# Patient Record
Sex: Male | Born: 1960 | Race: White | Hispanic: No | Marital: Single | Smoking: Never smoker
Health system: Southern US, Community
[De-identification: ages and names within clinical notes are randomized; demographics above are authoritative.]

## PROBLEM LIST (undated history)

## (undated) HISTORY — PX: OTHER SURGICAL HISTORY: SHX169

## (undated) HISTORY — PX: SINUS SURGERY WITH INSTATRAK: SHX5215

## (undated) HISTORY — PX: NASAL SINUS SURGERY: SHX719

---

## 1998-04-14 ENCOUNTER — Emergency Department (HOSPITAL_COMMUNITY): Admission: EM | Admit: 1998-04-14 | Discharge: 1998-04-14 | Payer: Self-pay | Admitting: Emergency Medicine

## 1999-03-24 ENCOUNTER — Encounter: Payer: Self-pay | Admitting: Pulmonary Disease

## 1999-03-24 ENCOUNTER — Ambulatory Visit (HOSPITAL_COMMUNITY): Admission: RE | Admit: 1999-03-24 | Discharge: 1999-03-24 | Payer: Self-pay | Admitting: Pulmonary Disease

## 1999-07-01 ENCOUNTER — Encounter: Admission: RE | Admit: 1999-07-01 | Discharge: 1999-07-01 | Payer: Self-pay | Admitting: Sports Medicine

## 1999-07-22 ENCOUNTER — Encounter: Admission: RE | Admit: 1999-07-22 | Discharge: 1999-07-22 | Payer: Self-pay | Admitting: Sports Medicine

## 2002-02-06 ENCOUNTER — Encounter: Payer: Self-pay | Admitting: Family Medicine

## 2002-02-06 ENCOUNTER — Encounter: Admission: RE | Admit: 2002-02-06 | Discharge: 2002-02-06 | Payer: Self-pay | Admitting: Family Medicine

## 2004-09-02 ENCOUNTER — Encounter: Admission: RE | Admit: 2004-09-02 | Discharge: 2004-09-02 | Payer: Self-pay | Admitting: Otolaryngology

## 2005-05-26 ENCOUNTER — Encounter: Admission: RE | Admit: 2005-05-26 | Discharge: 2005-05-26 | Payer: Self-pay | Admitting: Otolaryngology

## 2013-08-20 ENCOUNTER — Other Ambulatory Visit: Payer: Self-pay | Admitting: Orthopedic Surgery

## 2013-08-20 DIAGNOSIS — M545 Low back pain: Secondary | ICD-10-CM

## 2013-08-21 ENCOUNTER — Other Ambulatory Visit: Payer: Self-pay | Admitting: Orthopedic Surgery

## 2013-08-21 DIAGNOSIS — Z139 Encounter for screening, unspecified: Secondary | ICD-10-CM

## 2013-08-23 ENCOUNTER — Other Ambulatory Visit: Payer: Self-pay

## 2013-08-25 ENCOUNTER — Other Ambulatory Visit: Payer: Self-pay

## 2015-01-12 ENCOUNTER — Encounter (HOSPITAL_COMMUNITY): Payer: Self-pay | Admitting: Emergency Medicine

## 2015-01-12 ENCOUNTER — Emergency Department (HOSPITAL_COMMUNITY): Payer: BLUE CROSS/BLUE SHIELD

## 2015-01-12 ENCOUNTER — Observation Stay (HOSPITAL_COMMUNITY)
Admission: EM | Admit: 2015-01-12 | Discharge: 2015-01-14 | Disposition: A | Discharge status: Home / Self Care | Payer: BLUE CROSS/BLUE SHIELD | Attending: Internal Medicine | Admitting: Internal Medicine

## 2015-01-12 DIAGNOSIS — R079 Chest pain, unspecified: Secondary | ICD-10-CM | POA: Diagnosis present

## 2015-01-12 DIAGNOSIS — F329 Major depressive disorder, single episode, unspecified: Secondary | ICD-10-CM | POA: Diagnosis not present

## 2015-01-12 DIAGNOSIS — I2510 Atherosclerotic heart disease of native coronary artery without angina pectoris: Secondary | ICD-10-CM | POA: Diagnosis not present

## 2015-01-12 DIAGNOSIS — F4323 Adjustment disorder with mixed anxiety and depressed mood: Secondary | ICD-10-CM

## 2015-01-12 DIAGNOSIS — I10 Essential (primary) hypertension: Secondary | ICD-10-CM | POA: Diagnosis not present

## 2015-01-12 DIAGNOSIS — I209 Angina pectoris, unspecified: Secondary | ICD-10-CM

## 2015-01-12 DIAGNOSIS — I159 Secondary hypertension, unspecified: Secondary | ICD-10-CM | POA: Diagnosis not present

## 2015-01-12 DIAGNOSIS — F4320 Adjustment disorder, unspecified: Secondary | ICD-10-CM | POA: Diagnosis not present

## 2015-01-12 DIAGNOSIS — Z886 Allergy status to analgesic agent status: Secondary | ICD-10-CM | POA: Diagnosis not present

## 2015-01-12 DIAGNOSIS — F439 Reaction to severe stress, unspecified: Secondary | ICD-10-CM | POA: Diagnosis not present

## 2015-01-12 DIAGNOSIS — R0789 Other chest pain: Secondary | ICD-10-CM

## 2015-01-12 DIAGNOSIS — F419 Anxiety disorder, unspecified: Secondary | ICD-10-CM | POA: Diagnosis not present

## 2015-01-12 DIAGNOSIS — I6789 Other cerebrovascular disease: Secondary | ICD-10-CM

## 2015-01-12 LAB — URINALYSIS, ROUTINE W REFLEX MICROSCOPIC
BILIRUBIN URINE: NEGATIVE
Glucose, UA: NEGATIVE mg/dL
Hgb urine dipstick: NEGATIVE
Ketones, ur: NEGATIVE mg/dL
Leukocytes, UA: NEGATIVE
Nitrite: NEGATIVE
PROTEIN: NEGATIVE mg/dL
Specific Gravity, Urine: 1.023 (ref 1.005–1.030)
Urobilinogen, UA: 0.2 mg/dL (ref 0.0–1.0)
pH: 6 (ref 5.0–8.0)

## 2015-01-12 LAB — CBC
HCT: 45.6 % (ref 39.0–52.0)
HCT: 43.3 % (ref 39.0–52.0)
Hemoglobin: 15.4 g/dL (ref 13.0–17.0)
Hemoglobin: 14.9 g/dL (ref 13.0–17.0)
MCH: 30.9 pg (ref 26.0–34.0)
MCH: 31.2 pg (ref 26.0–34.0)
MCHC: 33.8 g/dL (ref 30.0–36.0)
MCHC: 34.4 g/dL (ref 30.0–36.0)
MCV: 91.4 fL (ref 78.0–100.0)
MCV: 90.8 fL (ref 78.0–100.0)
PLATELETS: 212 10*3/uL (ref 150–400)
Platelets: 228 10*3/uL (ref 150–400)
RBC: 4.99 MIL/uL (ref 4.22–5.81)
RBC: 4.77 MIL/uL (ref 4.22–5.81)
RDW: 12.5 % (ref 11.5–15.5)
RDW: 12.4 % (ref 11.5–15.5)
WBC: 6.3 10*3/uL (ref 4.0–10.5)
WBC: 7.1 10*3/uL (ref 4.0–10.5)

## 2015-01-12 LAB — I-STAT TROPONIN, ED: Troponin i, poc: 0 ng/mL (ref 0.00–0.08)

## 2015-01-12 LAB — TROPONIN I: Troponin I: <0.03 ng/mL (ref <0.031)

## 2015-01-12 LAB — COMPREHENSIVE METABOLIC PANEL
ALT: 47 U/L (ref 0–53)
AST: 34 U/L (ref 0–37)
Albumin: 4.1 g/dL (ref 3.5–5.2)
Alkaline Phosphatase: 80 U/L (ref 39–117)
Anion gap: 7 (ref 5–15)
BILIRUBIN TOTAL: 0.6 mg/dL (ref 0.3–1.2)
BUN: 23 mg/dL (ref 6–23)
CHLORIDE: 106 mmol/L (ref 96–112)
CO2: 24 mmol/L (ref 19–32)
Calcium: 9.1 mg/dL (ref 8.4–10.5)
Creatinine, Ser: 0.93 mg/dL (ref 0.50–1.35)
GFR calc Af Amer: >90 mL/min (ref >90)
GFR calc non Af Amer: >90 mL/min (ref >90)
GLUCOSE: 97 mg/dL (ref 70–99)
Potassium: 4 mmol/L (ref 3.5–5.1)
Sodium: 137 mmol/L (ref 135–145)
Total Protein: 7 g/dL (ref 6.0–8.3)

## 2015-01-12 LAB — PROTIME-INR
INR: 0.98 (ref 0.00–1.49)
Prothrombin Time: 13.1 seconds (ref 11.6–15.2)

## 2015-01-12 LAB — APTT: aPTT: 36 seconds (ref 24–37)

## 2015-01-12 LAB — CREATININE, SERUM
CREATININE: 0.95 mg/dL (ref 0.50–1.35)
GFR calc Af Amer: >90 mL/min (ref >90)

## 2015-01-12 IMAGING — CR DG CHEST 1V PORT
1 series · 1 of 1 positions shown · non-contrast
Comparison: None.

CLINICAL DATA: Intermittent left-sided chest pain radiating to left
arm.

EXAM:
PORTABLE CHEST - 1 VIEW

[AP]
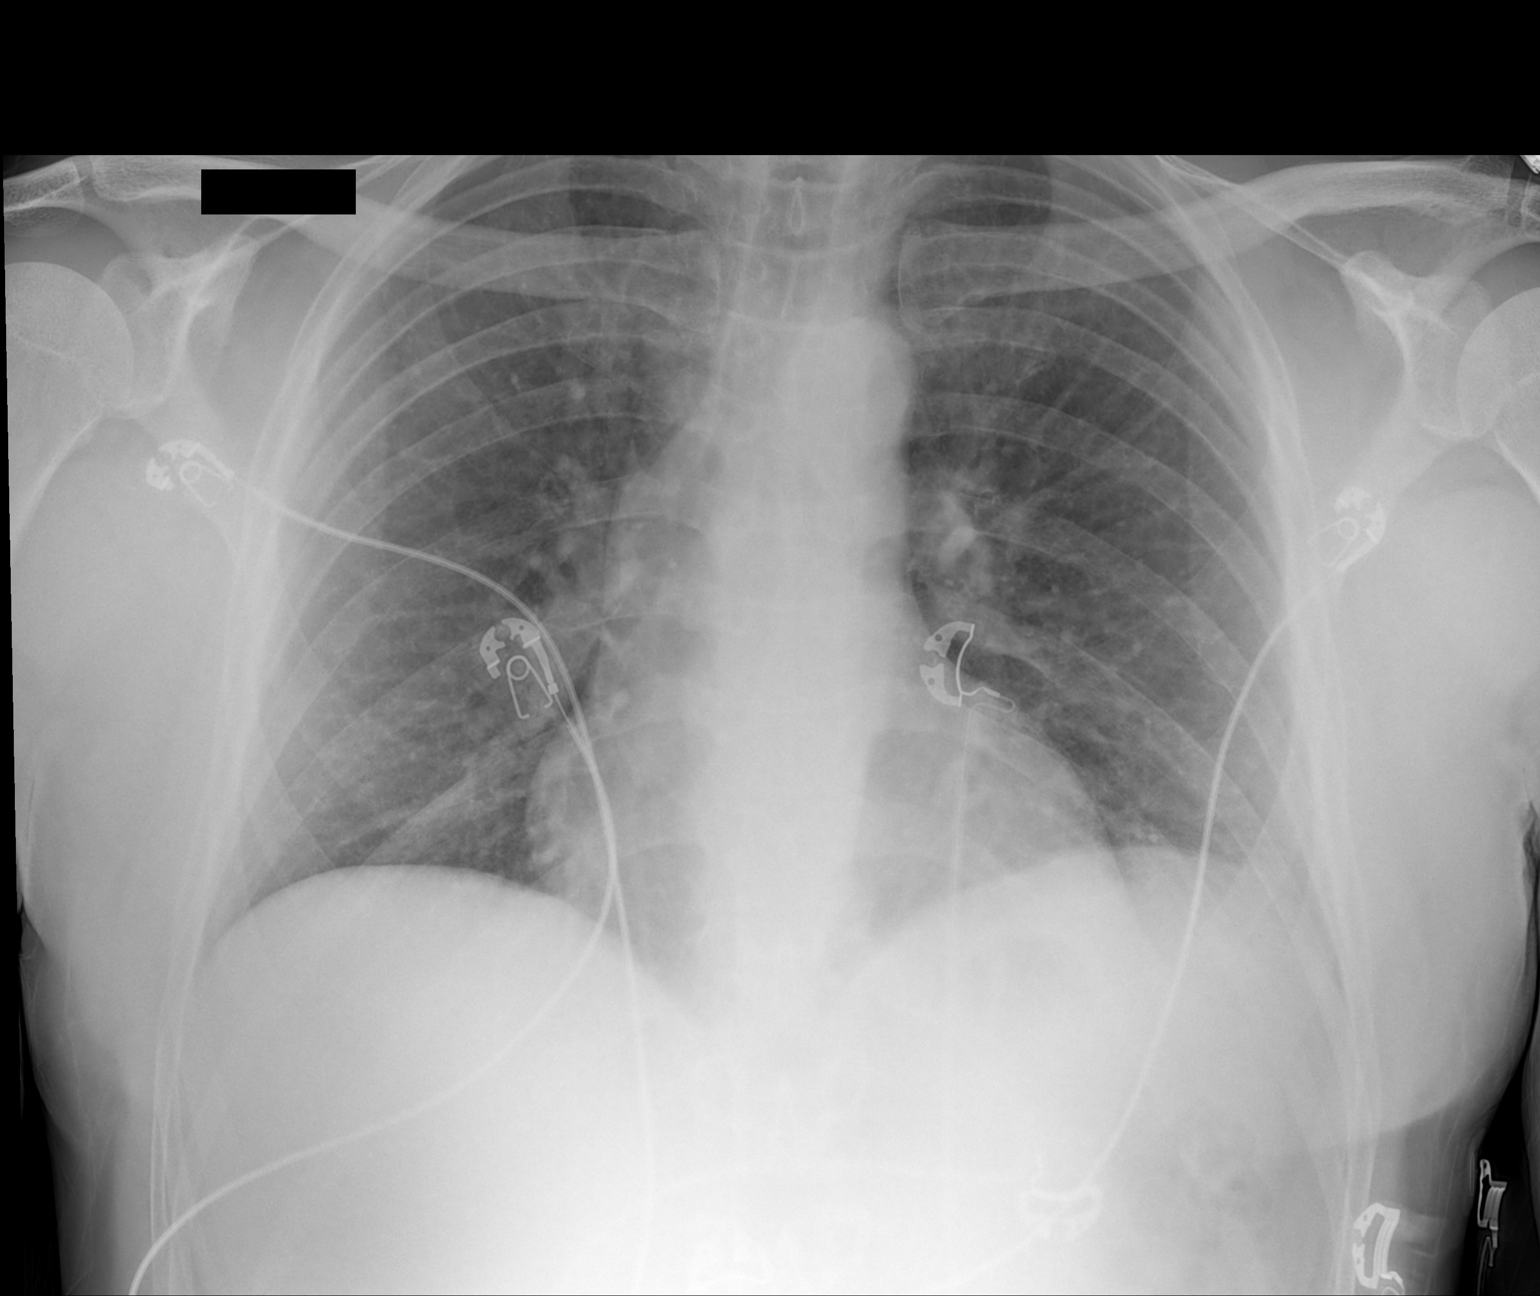

[1 of 1 positions shown; findings below may reference images not displayed]

FINDINGS: The heart size and mediastinal contours are within normal limits.
Both lungs are clear. The visualized skeletal structures are
unremarkable.
IMPRESSION: No active disease.

## 2015-01-12 MED ORDER — LISINOPRIL 10 MG PO TABS
10.0000 mg | ORAL_TABLET | Freq: Every day | ORAL | Status: DC
  Administered 2015-01-12 – 2015-01-14 (×3): 10 mg via ORAL
  Filled 2015-01-12 (×3): qty 1

## 2015-01-12 MED ORDER — HEPARIN SODIUM (PORCINE) 5000 UNIT/ML IJ SOLN
5000.0000 [IU] | Freq: Three times a day (TID) | INTRAMUSCULAR | Status: DC
  Administered 2015-01-12: 5000 [IU] via SUBCUTANEOUS
  Filled 2015-01-12 (×8): qty 1

## 2015-01-12 MED ORDER — ASPIRIN EC 81 MG PO TBEC
81.0000 mg | DELAYED_RELEASE_TABLET | Freq: Every day | ORAL | Status: DC
Start: 2015-01-13 — End: 2015-01-14
  Administered 2015-01-13 – 2015-01-14 (×2): 81 mg via ORAL
  Filled 2015-01-12 (×2): qty 1

## 2015-01-12 MED ORDER — ASPIRIN 81 MG PO CHEW
324.0000 mg | CHEWABLE_TABLET | ORAL | Status: DC
  Filled 2015-01-12: qty 4

## 2015-01-12 MED ORDER — ASPIRIN 81 MG PO CHEW
324.0000 mg | CHEWABLE_TABLET | Freq: Once | ORAL | Status: AC
  Administered 2015-01-12: 324 mg via ORAL
  Filled 2015-01-12: qty 4

## 2015-01-12 MED ORDER — SODIUM CHLORIDE 0.9 % IV SOLN: 20.0000 mL | INTRAVENOUS | Status: DC

## 2015-01-12 MED ORDER — ONDANSETRON HCL 4 MG/2ML IJ SOLN: 4.0000 mg | Freq: Four times a day (QID) | INTRAMUSCULAR | Status: DC | PRN

## 2015-01-12 MED ORDER — ASPIRIN 300 MG RE SUPP
300.0000 mg | RECTAL | Status: DC
  Filled 2015-01-12: qty 1

## 2015-01-12 MED ORDER — AMLODIPINE BESYLATE 5 MG PO TABS
5.0000 mg | ORAL_TABLET | Freq: Every day | ORAL | Status: DC
  Administered 2015-01-12 – 2015-01-14 (×3): 5 mg via ORAL
  Filled 2015-01-12 (×3): qty 1

## 2015-01-12 MED ORDER — NITROGLYCERIN 0.4 MG SL SUBL
0.4000 mg | SUBLINGUAL_TABLET | SUBLINGUAL | Status: DC | PRN
Start: 2015-01-12 — End: 2015-01-14

## 2015-01-12 MED ORDER — NITROGLYCERIN 0.4 MG SL SUBL: 0.4000 mg | SUBLINGUAL_TABLET | SUBLINGUAL | Status: DC | PRN

## 2015-01-12 MED ORDER — ACETAMINOPHEN 325 MG PO TABS: 650.0000 mg | ORAL_TABLET | ORAL | Status: DC | PRN

## 2015-01-12 NOTE — ED Notes (Signed)
Pt declined to an IV at this time.

## 2015-01-12 NOTE — ED Provider Notes (Signed)
CSN: [id number]     Arrival date & time 01/12/15  0758 History   First MD Initiated Contact with Patient 01/12/15 [phone]     Chief Complaint  Patient presents with  . Chest Pain     (Consider location/radiation/quality/duration/timing/severity/associated sxs/prior Treatment) Patient is a 54 y.o. male presenting with chest pain. The history is provided by the patient.  Chest Pain Pain location:  L chest Pain quality: stabbing   Pain radiates to:  L jaw, R jaw and L arm Pain radiates to the back: no   Pain severity:  Mild Timing:  Sporadic Progression:  Worsening Chronicity:  New Context: movement and stress   Context: not breathing, no drug use, not eating and not at rest   Relieved by:  Rest Worsened by:  Nothing tried Ineffective treatments:  None tried Associated symptoms: nausea and shortness of breath     History reviewed. No pertinent past medical history. Past Surgical History  Procedure Laterality Date  . Nasal sinus surgery    . Skin cancer surery     No family history on file. History  Substance Use Topics  . Smoking status: Never Smoker   . Smokeless tobacco: Not on file  . Alcohol Use: No    Review of Systems  Respiratory: Positive for shortness of breath.   Cardiovascular: Positive for chest pain.  Gastrointestinal: Positive for nausea.  All other systems reviewed and are negative.     Allergies  Asa  Home Medications   Prior to Admission medications   Not on File   BP 178/99 mmHg  Pulse 70  Temp(Src) 97.8 F (36.6 C) (Oral)  Resp 15  SpO2 100% Physical Exam  Constitutional: He is oriented to person, place, and time. He appears well-developed and well-nourished.  HENT:  Head: Normocephalic and atraumatic.  Eyes: EOM are normal. Pupils are equal, round, and reactive to light.  Neck: Normal range of motion. Neck supple.  Cardiovascular: Normal rate, regular rhythm, normal heart sounds and intact distal pulses.   Pulmonary/Chest: Effort  normal and breath sounds normal.  Abdominal: Soft. Bowel sounds are normal.  Musculoskeletal: Normal range of motion. He exhibits no edema or tenderness.  Neurological: He is alert and oriented to person, place, and time.  Skin: Skin is warm and dry.  Psychiatric: He has a normal mood and affect. His behavior is normal. Judgment and thought content normal.  Nursing note and vitals reviewed.   ED Course  Procedures (including critical care time) Labs Review Labs Reviewed  CBC  APTT  COMPREHENSIVE METABOLIC PANEL  PROTIME-INR  URINALYSIS, ROUTINE W REFLEX MICROSCOPIC  I-STAT TROPOININ, ED    Imaging Review No results found.   EKG Interpretation   Date/Time:  Monday January 12 2015 08:05:32 EST Ventricular Rate:  69 PR Interval:  174 QRS Duration: 100 QT Interval:  399 QTC Calculation: 427 R Axis:   81 Text Interpretation:  Sinus rhythm Borderline T abnormalities, anterior  leads Confirmed by  MD,  ([id number]) on 01/12/2015 8:20:10 AM      MDM   Final diagnoses:  Chest pain, unspecified chest pain type      10:42 AM Discussed with cardiology and they will be in to see.  Patient informed of plan and voices understanding.    11:38 AM Dr. Anne FuSkains saw and assessed and plans stress test tomorrow.  Advises to have hospitalist admit  Hilario Quarryanielle S , MD 01/12/15 1207

## 2015-01-12 NOTE — Care Management Note (Signed)
    Page 1 of 1   01/12/2015     3:00:55 PM CARE MANAGEMENT NOTE 01/12/2015  Patient:  Glenn Lawson,Glenn Lawson   Account Number:  [id number]  Date Initiated:  01/12/2015  Documentation initiated by:  Lanier ClamMAHABIR,  Subjective/Objective Assessment:   54 y/o m admitted w/Chest pain.     Action/Plan:   From home.   Anticipated DC Date:  01/13/2015   Anticipated DC Plan:  HOME/SELF CARE      DC Planning Services  CM consult      Choice offered to / List presented to:             Status of service:  In process, will continue to follow Medicare Important Message given?   (If response is "NO", the following Medicare IM given date fields will be blank) Date Medicare IM given:   Medicare IM given by:   Date Additional Medicare IM given:   Additional Medicare IM given by:    Discharge Disposition:    Per UR Regulation:    If discussed at Long Length of Stay Meetings, dates discussed:    Comments:  01/12/15 Lanier ClamKathy  RN BSN NCM [phone] Monitor progress for d/c needs.No anticipated d/c needs.

## 2015-01-12 NOTE — H&P (Signed)
[facility] History and Physical  Glenn Lawson E Kurowski ZOX:[id number] DOB: 1961-04-06 DOA: 01/12/2015  Referring physician: ED PCP: No primary care provider on file.  Specialists: Cardiology  Chief Complaint: CP x 1-3 weeks  HPI:  54 y/o ?, bland PMH [facility] with intermittent chest pain.  3 week history, on and off, occasional left arm radiation.  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  ++ Multiple psycho-social stressors-52 year old son = asperger's disease + autism spectrum-very challenging home situation.  No fever no chills no nausea no vomiting no recent long car travel, no testosterone use nonsmoker, teetotaler. Pain intensified when he helping patient do passive AROM exercises. Patient himself feels that this is all stress related-no musculoskeletal tenderness. Noted his blood pressure was elevated yesterday and today with lightheadedness Mother and father both have coronary history-sister who is a smoker has history of blockages Patient himself used to be in excellent shape, triathlete-endurance athlete [place] the [facility] [employer] [employer] 2005. Sedentary X 3 years~current weight 190, previously 160. Currently chest pain-free Aspirin given in emergency room and cardiology consulted    Review of Systems:   A 14 point ROS was performed and is negative except as noted in the HPI   History reviewed. No pertinent past medical history. Past Surgical History  Procedure Laterality Date  . Nasal sinus surgery    . Skin cancer surery    . Sinus surgery with instatrak     Social History:  History   Social History Narrative  . No narrative on file    Allergies  Allergen Reactions  . Asa [Aspirin] Other (See Comments)    Nose bleeds    History reviewed. No pertinent family history.   Prior to Admission medications   Medication Sig Start Date End Date Taking? Authorizing Provider  ibuprofen (ADVIL,MOTRIN) 200 MG tablet Take 400 mg by mouth every 6 (six)  hours as needed for headache or moderate pain.   Yes Historical Provider, MD   Physical Exam: Filed Vitals:   01/12/15 1015 01/12/15 1030 01/12/15 1049 01/12/15 1200  BP:   145/102 149/97  Pulse:   64 61  Temp:      TempSrc:      Resp: 18 16 14 12   SpO2:   97% 97%   EOMI NCAT no pallor noted, no icterus-flat affect, poor eye contact Neck soft supple, no JVD, no bruit S1-S2 no murmur rub or gallop, sinus to sinus bradycardia Chest clinically clear with no added sounds no rails no rhonchi no wheezes Abdomen soft nontender non-distended no rebound No lower extremity edema Range of motion intact, bilateral strength 5/5 reflexes not tested  Labs on Admission:  Basic Metabolic Panel:  Recent Labs Lab 01/12/15 0831  NA 137  K 4.0  CL 106  CO2 24  GLUCOSE 97  BUN 23  CREATININE 0.93  CALCIUM 9.1   Liver Function Tests:  Recent Labs Lab 01/12/15 0831  AST 34  ALT 47  ALKPHOS 80  BILITOT 0.6  PROT 7.0  ALBUMIN 4.1   No results for input(s): LIPASE, AMYLASE in the last 168 hours. No results for input(s): AMMONIA in the last 168 hours. CBC:  Recent Labs Lab 01/12/15 0828  WBC 6.3  HGB 15.4  HCT 45.6  MCV 91.4  PLT 228   Cardiac Enzymes: No results for input(s): CKTOTAL, CKMB, CKMBINDEX, TROPONINI in the last 168 hours.  BNP (last 3 results) No results for input(s): BNP in the last 8760 hours.  ProBNP (  last 3 results) No results for input(s): PROBNP in the last 8760 hours.  CBG: No results for input(s): GLUCAP in the last 168 hours.  Radiological Exams on Admission: Dg Chest Port 1 View  01/12/2015   CLINICAL DATA:  Intermittent left-sided chest pain radiating to left arm.  EXAM: PORTABLE CHEST - 1 VIEW  COMPARISON:  None.  FINDINGS: The heart size and mediastinal contours are within normal limits. Both lungs are clear. The visualized skeletal structures are unremarkable.  IMPRESSION: No active disease.   Electronically Signed   By: Charlett Nose M.D.   On:  01/12/2015 09:15    EKG: Independently reviewed. Sinus rhythm PR interval 0 point 20, QRS axis 90, T wave inversion V3 and V1 however no peaking elseWHERE  Assessment/Plan Active Problems:   Chest pain secondary to acute coronary syndrome-potential stable angina-HEART SCORE OF 6-I'll advise patient for inpatient hospital stay.  Cardiology has elected to discuss with the patient possible exercise Myoview.  They have added lisinopril/amlodipine which will be continued for remodeling prevention and angina No role at as an time for heparinization secondary to no chest pain, no leak of troponin-if either of these issues change cardiology needs to be contacted regarding this Echocardiogram has been ordered We appreciate cardiology input into this gentleman's care We will assist where we can  Adjustment disorder/Depression NOS-15 minute discussion with patient about his son's diagnosis , the patient's anger management issues as well as feelings of depression, guilt etc. I have offered him psychiatry input. He has sought counseling in the past through his church and I have encouraged him to think about different modalities such as Aperger's support groups etc. etc.    It would not be unreasonable to place him on a low dose of valproate or lithium-I will readdress this with him in the morning  Hypertension-probably related to #2.   Time spent: 69  Mahala Menghini [facility] Pager [phone]  If 7PM-7AM, please contact night-coverage [email or web address] Password [facility] 01/12/2015, 12:36 PM

## 2015-01-12 NOTE — Consult Note (Signed)
Admit date: 01/12/2015 Referring Physician  Dr. Rosalia Hammers Primary Physician No primary care provider on file. Primary Cardiologist  none Reason for Consultation  chest pain  HPI: 54 year old male who presented to [facility] long emergency department with chest pain, jaw pain, bilateral jaw, left arm which seems to be exacerbated by stress. 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 He works currently [employer]. Earlier this morning he was working on a lady with a shoulder repair and lifting his arm started to feel somewhat dizzy. His left arm began to bother him slightly and he was feeling some chest pressure. He also felt a sensation is jaw. He stated that the overall sensation was fairly mild but he was concerned enough to come to the emergency room. He states that he used to be a fairly trained athlete but over the last few years has decreased his overall physical activity. He does occasionally walk on trails currently, up hills and has not experienced any exertional anginal symptoms. His sister has had coronary artery disease with stent placement. Other family members as well, mother for instance has had coronary disease.  Today also he felt some mild nausea and this concerned him. He admits that he has not been taking good care of himself, has not been exercising as much as he should. He has gained 25 pounds over the last 2 years. He is under a high amount of stress with family dynamics, his 31 year old son has Asperger's and he struggles with this. He states that he has been to counseling in the past. 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   EKG shows T-wave inversion in V3, V4 as well as 3 and aVF.    PMH:  History reviewed. No pertinent past medical history.  PSH:   Past Surgical History  Procedure Laterality Date  . Nasal sinus surgery    . Skin cancer surery     Allergies:  Asa Prior to Admit  Meds:   Prior to Admission medications   Medication Sig Start Date End Date Taking? Authorizing Provider  ibuprofen (ADVIL,MOTRIN) 200 MG tablet Take 400 mg by mouth every 6 (six) hours as needed for headache or moderate pain.   Yes Historical Provider, MD   Fam HX:  Mother, sister has coronary artery disease   Social HX:   No smoking, no alcohol, no drug use History   Social History  . Marital Status: Single    Spouse Name: N/A    Number of Children: N/A  . Years of Education: N/A   Occupational History  . Not on file.   Social History Main Topics  . Smoking status: Never Smoker   . Smokeless tobacco: Not on file  . Alcohol Use: No  . Drug Use: Not on file  . Sexual Activity: Not on file   Other Topics Concern  . Not on file   Social History Narrative  . No narrative on file     ROS:  All 11 ROS were addressed and are negative except what is stated in the HPI   Physical Exam: Blood pressure 145/102, pulse 64, temperature 97.8 F (36.6 C), temperature source Oral, resp. rate 14, SpO2 97 %.   General: Well developed, well nourished, in no acute distress Head: Eyes PERRLA, No xanthomas.   Normal cephalic and atramatic  Lungs:   Clear bilaterally to auscultation and percussion. Normal respiratory effort.  No wheezes, no rales. Heart:   HRRR S1 S2 Pulses are 2+ & equal. No murmur, rubs, gallops.  No carotid bruit. No JVD.  No abdominal bruits.  Abdomen: Bowel sounds are positive, abdomen soft and non-tender without masses. No hepatosplenomegaly. Msk:  Back normal. Normal strength and tone for age. Extremities:  No clubbing, cyanosis or edema.  DP +1 Neuro: Alert and oriented X 3, non-focal, MAE x 4 GU: Deferred Rectal: Deferred Psych:  Good affect, somewhat flattened, somewhat depressed demeanor      Labs: Lab Results  Component Value Date   WBC 6.3 01/12/2015   HGB 15.4 01/12/2015   HCT 45.6 01/12/2015   MCV 91.4 01/12/2015   PLT 228 01/12/2015     Recent  Labs Lab 01/12/15 0831  NA 137  K 4.0  CL 106  CO2 24  BUN 23  CREATININE 0.93  CALCIUM 9.1  PROT 7.0  BILITOT 0.6  ALKPHOS 80  ALT 47  AST 34  GLUCOSE 97   Point-of-care troponin is normal. Lab work unremarkable.   Radiology:  Dg Chest Port 1 View  01/12/2015   CLINICAL DATA:  Intermittent left-sided chest pain radiating to left arm.  EXAM: PORTABLE CHEST - 1 VIEW  COMPARISON:  None.  FINDINGS: The heart size and mediastinal contours are within normal limits. Both lungs are clear. The visualized skeletal structures are unremarkable.  IMPRESSION: No active disease.   Electronically Signed   By: Charlett NoseKevin  Dover M.D.   On: 01/12/2015 09:15   Personally viewed.  EKG: Sinus rhythm, T-wave inversion V3, V4, 3 and aVF.  Personally viewed.   ASSESSMENT/PLAN:    54 year old male with chest pain/jaw pain with T-wave inversions noted on EKG. hypertension, stress anxiety  1. Chest pain-accompanied with EKG changes which may be suggestive of ischemia versus uncontrolled hypertension/LVH. We will check echocardiogram. I will order nuclear stress test, treadmill for him to perform tomorrow. I would like for his blood pressure to be under better control. I have started lisinopril 10 mg as well as amlodipine 5 mg. He was hesitant to start a mild diuretic. He did not want any medication that would inhibit his physical performance. His sister has had coronary artery disease, stent placement. There is a chance that he may very well have flow limiting coronary artery disease. We discussed the possibility of invasive/carry a catheterization. He would rather go the route of stress test which is not unreasonable specially since his troponin at this point is normal. If the troponin becomes abnormal, we will proceed with cardiac catheterization.  2. Essential hypertension-he has not been taking anything for blood pressure. I will go ahead and start lisinopril 10, amlodipine 5. I will check an echocardiogram to  ensure that he does not have left ventricular hypertrophy. We will avoid beta blocker with upcoming stress test.  3. Stress/anxiety-his son has Asperger's and is 52 years old and he has been to counseling to help with dealing with the situation. He struggles with this.  We will follow along with Hospitalist.  [name], , MD  01/12/2015  10:57 AM

## 2015-01-12 NOTE — ED Notes (Signed)
Pt states over the past week he has been having intermittent left sided chest pain but this morning the pain came on and radiated left arm and had a sensation in his jaw but he states it wasn't painful.  Pt states that he did have some dizziness, and nausea.

## 2015-01-13 ENCOUNTER — Ambulatory Visit (HOSPITAL_COMMUNITY)
Admission: EM | Admit: 2015-01-13 | Discharge: 2015-01-13 | Disposition: A | Discharge status: Home / Self Care | Payer: BLUE CROSS/BLUE SHIELD | Source: Home / Self Care | Attending: Family Medicine | Admitting: Family Medicine

## 2015-01-13 ENCOUNTER — Other Ambulatory Visit: Payer: Self-pay

## 2015-01-13 DIAGNOSIS — R079 Chest pain, unspecified: Secondary | ICD-10-CM | POA: Diagnosis not present

## 2015-01-13 DIAGNOSIS — I472 Ventricular tachycardia: Secondary | ICD-10-CM | POA: Diagnosis not present

## 2015-01-13 DIAGNOSIS — I10 Essential (primary) hypertension: Secondary | ICD-10-CM | POA: Diagnosis present

## 2015-01-13 LAB — BASIC METABOLIC PANEL
ANION GAP: 10 (ref 5–15)
BUN: 17 mg/dL (ref 6–23)
CHLORIDE: 103 mmol/L (ref 96–112)
CO2: 26 mmol/L (ref 19–32)
Calcium: 9.1 mg/dL (ref 8.4–10.5)
Creatinine, Ser: 0.97 mg/dL (ref 0.50–1.35)
GFR calc Af Amer: >90 mL/min (ref >90)
GFR calc non Af Amer: >90 mL/min (ref >90)
Glucose, Bld: 99 mg/dL (ref 70–99)
POTASSIUM: 4.5 mmol/L (ref 3.5–5.1)
Sodium: 139 mmol/L (ref 135–145)

## 2015-01-13 MED ORDER — TECHNETIUM TC 99M SESTAMIBI GENERIC - CARDIOLITE
10.0000 | Freq: Once | INTRAVENOUS | Status: AC | PRN
  Administered 2015-01-13: 10 via INTRAVENOUS

## 2015-01-13 MED ORDER — TECHNETIUM TC 99M SESTAMIBI GENERIC - CARDIOLITE
30.0000 | Freq: Once | INTRAVENOUS | Status: AC | PRN
  Administered 2015-01-13: 30 via INTRAVENOUS

## 2015-01-13 NOTE — Progress Notes (Signed)
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  This return to school was an extremely stressful experience for him & his work is high stress. [facility] with various complaints of L-sided chest pain, bilateral jaw discomfort & left arm pain (not all together & not exertional).  Has also noted nausea.  EKG noted to have TWI in V3, V4 & III, aVF (? Repolarization changes) Seen by Dr. Anne FuSkains in consultation - recommended TM Myoview   Subjective:  Had a few "twinges" of discomfort - different than admitting CP.  Also notes nausea.  Objective:  Vital Signs in the last 24 hours: Temp:  [97.4 F (36.3 C)-97.5 F (36.4 C)] 97.4 F (36.3 C) (02/09 0511) Pulse Rate:  [61-82] 65 (02/09 0511) Resp:  [12-19] 18 (02/09 0511) BP: (131-164)/(86-111) 131/88 mmHg (02/09 0511) SpO2:  [96 %-99 %] 96 % (02/09 0511) Weight:  [185 lb 6.4 oz (84.097 kg)] 185 lb 6.4 oz (84.097 kg) (02/08 1316)  Intake/Output from previous day: 02/08 0701 - 02/09 0700 In: 360 [P.O.:360] Out: -  Intake/Output from this shift:    Physical Exam: General appearance: alert, cooperative, appears stated age and seems tired & stressed.  otherwise healthy appearing Neck: no adenopathy, no carotid bruit, no JVD and supple, symmetrical, trachea midline Lungs: clear to auscultation bilaterally and normal percussion bilaterally Heart: regular rate and rhythm, S1, S2 normal, no murmur, click, rub or gallop Abdomen: soft, non-tender; bowel sounds normal; no masses,  no organomegaly Extremities: extremities normal, atraumatic, no cyanosis or edema Pulses: 2+ and symmetric Neurologic: Mental status: Alert, oriented, thought content appropriate Cranial nerves: normal  Lab Results:  Recent Labs  01/12/15 0828 01/12/15 1351  WBC 6.3 7.1  HGB 15.4 14.9  PLT 228 212    Recent Labs  01/12/15 0831 01/12/15 1351 01/13/15 0507  NA 137  --  139  K 4.0  --  4.5  CL 106  --  103  CO2 24  --  26  GLUCOSE 97  --  99  BUN 23  --  17  CREATININE 0.93 0.95 0.97    Recent Labs  01/12/15 1351 01/12/15 1852  TROPONINI <0.03 <0.03   Hepatic Function Panel  Recent Labs  01/12/15 0831  PROT 7.0  ALBUMIN 4.1  AST 34  ALT 47  ALKPHOS 80  BILITOT 0.6   No results for input(s): CHOL in the last 72 hours. No results for input(s): PROTIME in the last 72 hours.  Imaging: Imaging results have been reviewed  Cardiac Studies:  2 D Echo: EF 60-65%, Grade 1 DD (abnormal relaxation -- "normal for age").  No regional wall motion abnormalities. No notable valvular disease.  Assessment/Plan:  Principal Problem:   Chest pain with moderate risk for cardiac etiology Active Problems:   Essential hypertension  1. CP with moderate risk for cardiac etiology: Abnormal EKG & HTN in 54 y/o man who was previously healthy. He would like reassurance that he will be safe returning to he prior exercise regimen.  No regional WMA with normal EF on Echo. 2. Essential HTN: BP looks great on CCB & ACE-I.  He noted that his BP was in 120s during PCP evaluation over the Christmas Holidays --> I suspect that his HTN may well have been stress related.  Was also noting HA, which can be associated with HTN.  Echo was essentially normal but with Gr 1 Diastolic Dysfxn -which is consistent with HTN Dx. 3. Stress: He is under a great deal of stress. May need additional counseling vs. SSRI.  Reassurance with Myoview will hopefully be helpful.    If Myoview is normal (no evidence of ischemia) - would be ok to d/c today from Cardiology standpoint --> can arrange OP f/u with Dr. Anne Fu.    LOS: 1 day    ,  W 01/13/2015, 9:07 AM

## 2015-01-13 NOTE — Progress Notes (Signed)
UR completed 

## 2015-01-13 NOTE — Progress Notes (Signed)
Pt refused 12 lead EKG, Dr. Mahala MenghiniSamtani was notified. Pt is currently stable, alert and oriented, no complaint of chest pain

## 2015-01-13 NOTE — Progress Notes (Signed)
ELIGA ARVIE ZOX:[id number] DOB: [birth year] DOA: 01/12/2015 PCP: No primary care provider on file.  Brief narrative:  54 y/o ?, bland PMH came to [facility] 01/12/15 with intermittent chest pain. 3 week history, on and off, occasional left arm radiation. 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 ++ Multiple psycho-social stressors-31 year old son = asperger's disease + autism spectrum-very challenging home situation. No fever no chills no nausea no vomiting no recent long car travel, no testosterone use nonsmoker, teetotaler. Because of the findings that he had a stress test that was low risk Unfortunately he had nonsustained VT during the stress test and was recommended for cardiac catheterization  Past medical history-As per Problem list Chart reviewed as below- Reviewed  Consultants:  Cardiology  Procedures:  Nuclear stress test 2/9  Antibiotics:  None   Subjective  Frustrated Louann Sjogren keeps calling him and requests that he work No chest pain per se Still somewhat stressed out because of multiple issues > 30 minute discussion in person with him and his wife today in the room   Objective    Interim History:   Telemetry: Sinus   Objective: Filed Vitals:   01/12/15 1434 01/12/15 2032 01/13/15 0511 01/13/15 1255  BP: 141/100 136/86 131/88 123/68  Pulse:  82 65 92  Temp:  97.5 F (36.4 C) 97.4 F (36.3 C) 98 F (36.7 C)  TempSrc:  Oral Oral Oral  Resp:  Height:      Weight:      SpO2:  97% 96% 97%   No intake or output data in the 24 hours ending 01/13/15 1905  Exam:  General: EOMI NCAT Cardiovascular: S1-S2 no murmur rub or gallop Respiratory: Clinically clear Abdomen: Soft nontender nondistended no rebound no guarding Skin no lower extremity edema Neuro grossly intact  Data Reviewed: Basic Metabolic Panel:  Recent Labs Lab 01/12/15 0831 01/12/15 1351 01/13/15 0507  NA 137  --  139  K 4.0  --  4.5  CL 106  --  103    CO2 24  --  26  GLUCOSE 97  --  99  BUN 23  --  17  CREATININE 0.93 0.95 0.97  CALCIUM 9.1  --  9.1   Liver Function Tests:  Recent Labs Lab 01/12/15 0831  AST 34  ALT 47  ALKPHOS 80  BILITOT 0.6  PROT 7.0  ALBUMIN 4.1   No results for input(s): LIPASE, AMYLASE in the last 168 hours. No results for input(s): AMMONIA in the last 168 hours. CBC:  Recent Labs Lab 01/12/15 0828 01/12/15 1351  WBC 6.3 7.1  HGB 15.4 14.9  HCT 45.6 43.3  MCV 91.4 90.8  PLT 228 212   Cardiac Enzymes:  Recent Labs Lab 01/12/15 1351 01/12/15 1852  TROPONINI <0.03 <0.03   BNP: Invalid input(s): POCBNP CBG: No results for input(s): GLUCAP in the last 168 hours.  No results found for this or any previous visit (from the past 240 hour(s)).   Studies:              All Imaging reviewed and is as per above notation   Scheduled Meds: . amLODipine  5 mg Oral Daily  . aspirin EC  81 mg Oral Daily  . heparin  5,000 Units Subcutaneous 3 times per day  . lisinopril  10 mg Oral Daily   Continuous Infusions: . sodium chloride       Assessment/Plan: 1. Low risk chest pain for cardiac cath in  a.m. rule out cause for SVT-appreciate cardiology input. Continue lisinopril 10 mg daily, amlodipine 5 daily-if [facility] and cardiac cath turns out to be positive, cardiology PA Corine ShelterLuke Kilroy has indicated patient will stay on cardiology service.  If not, [facility] and be discharged. 2. Multiple psychosocial stressors/adjustment disorder-I had a 30 minute conversation with the patient today at bedside about stress and nonpharmacological modalities such as deep breathing etc. etc. Which have been proven to help with this-I've encouraged him to think about his current job situation and other modalities for treatment of this. He seems amenable   Code Status: Full  Family Communication: Discussed with wife at bedside  Disposition Plan: Inpatient  pending cardiac catheterization  Prolonged care-I spent over 45 minutes direct face-to-face time with this patient and one hour in preparation of this note  Pleas KochJai , MD  [facility] Pager [phone] 01/13/2015, 7:05 PM    LOS: 1 day

## 2015-01-13 NOTE — Progress Notes (Signed)
Assumed care of pt. No changes in intitial am assessment at this time. Cont with plan of care 

## 2015-01-13 NOTE — Progress Notes (Signed)
Pt exercised 11 minutes of Bruce. He had no chest pain. He did have runs of NSWCT at peak exercise (Looks like NSVT), and PVCs in recovery. Images pending.  Corine ShelterLUKE  PA-C 01/13/2015 11:13 AM

## 2015-01-14 ENCOUNTER — Encounter (HOSPITAL_COMMUNITY): Payer: Self-pay | Admitting: Cardiovascular Disease

## 2015-01-14 ENCOUNTER — Encounter (HOSPITAL_COMMUNITY)
Admission: EM | Disposition: A | Discharge status: Home / Self Care | Payer: Self-pay | Source: Home / Self Care | Attending: Emergency Medicine

## 2015-01-14 DIAGNOSIS — I10 Essential (primary) hypertension: Secondary | ICD-10-CM | POA: Diagnosis not present

## 2015-01-14 DIAGNOSIS — R079 Chest pain, unspecified: Secondary | ICD-10-CM | POA: Diagnosis not present

## 2015-01-14 DIAGNOSIS — I472 Ventricular tachycardia: Secondary | ICD-10-CM | POA: Diagnosis not present

## 2015-01-14 DIAGNOSIS — I2510 Atherosclerotic heart disease of native coronary artery without angina pectoris: Secondary | ICD-10-CM

## 2015-01-14 HISTORY — PX: LEFT HEART CATHETERIZATION WITH CORONARY ANGIOGRAM: SHX5451

## 2015-01-14 SURGERY — LEFT HEART CATHETERIZATION WITH CORONARY ANGIOGRAM
Anesthesia: LOCAL

## 2015-01-14 MED ORDER — HEPARIN SODIUM (PORCINE) 1000 UNIT/ML IJ SOLN
INTRAMUSCULAR | Status: AC
  Filled 2015-01-14: qty 1

## 2015-01-14 MED ORDER — VERAPAMIL HCL 2.5 MG/ML IV SOLN
INTRAVENOUS | Status: AC
  Filled 2015-01-14: qty 2

## 2015-01-14 MED ORDER — SODIUM CHLORIDE 0.9 % IV SOLN: 250.0000 mL | INTRAVENOUS | Status: DC | PRN

## 2015-01-14 MED ORDER — SODIUM CHLORIDE 0.9 % IV SOLN: INTRAVENOUS | Status: AC

## 2015-01-14 MED ORDER — NITROGLYCERIN 1 MG/10 ML FOR IR/CATH LAB
INTRA_ARTERIAL | Status: AC
  Filled 2015-01-14: qty 10

## 2015-01-14 MED ORDER — LIDOCAINE HCL (PF) 1 % IJ SOLN
INTRAMUSCULAR | Status: AC
  Filled 2015-01-14: qty 30

## 2015-01-14 MED ORDER — SODIUM CHLORIDE 0.9 % IJ SOLN: 3.0000 mL | INTRAMUSCULAR | Status: DC | PRN

## 2015-01-14 MED ORDER — AMLODIPINE BESYLATE 5 MG PO TABS: 5.0000 mg | ORAL_TABLET | Freq: Every day | ORAL | Status: DC

## 2015-01-14 MED ORDER — LISINOPRIL 10 MG PO TABS: 10.0000 mg | ORAL_TABLET | Freq: Every day | ORAL | Status: DC

## 2015-01-14 MED ORDER — MIDAZOLAM HCL 2 MG/2ML IJ SOLN
INTRAMUSCULAR | Status: AC
  Filled 2015-01-14: qty 2

## 2015-01-14 MED ORDER — HEPARIN (PORCINE) IN NACL 2-0.9 UNIT/ML-% IJ SOLN
INTRAMUSCULAR | Status: AC
  Filled 2015-01-14: qty 1000

## 2015-01-14 MED ORDER — SODIUM CHLORIDE 0.9 % IV SOLN
1.0000 mL/kg/h | INTRAVENOUS | Status: DC
  Administered 2015-01-14: 1 mL/kg/h via INTRAVENOUS

## 2015-01-14 MED ORDER — SODIUM CHLORIDE 0.9 % IJ SOLN
3.0000 mL | Freq: Two times a day (BID) | INTRAMUSCULAR | Status: DC
  Administered 2015-01-14: 3 mL via INTRAVENOUS

## 2015-01-14 MED ORDER — HEPARIN (PORCINE) IN NACL 2-0.9 UNIT/ML-% IJ SOLN
INTRAMUSCULAR | Status: AC
  Filled 2015-01-14: qty 500

## 2015-01-14 MED ORDER — FENTANYL CITRATE 0.05 MG/ML IJ SOLN
INTRAMUSCULAR | Status: AC
  Filled 2015-01-14: qty 2

## 2015-01-14 NOTE — CV Procedure (Addendum)
   Cardiac Catheterization Procedure Note  Name: Glenn Lawson MRN: [id number] DOB: 09-18-1961  Procedure: Left Heart Cath, Selective Coronary Angiography, LV angiography   Indication:  Chest pain with ventricular tachycardia noted during stress test.  Medications:  Sedation:   2 mg IV Versed,  and 50 mcg IV Fentanyl   Procedural Details: The right wrist was prepped, draped, and anesthetized with 1% lidocaine. Using the modified Seldinger technique, a 5 French sheath was introduced into the right radial artery. 3 mg of verapamil was administered through the sheath, weight-based unfractionated heparin was administered intravenously. A Jackie catheter was used for selective coronary angiography and left ventriculography. Catheter exchanges were performed over an exchange length guidewire. There were no immediate procedural complications. A TR band was used for radial hemostasis at the completion of the procedure.  The patient was transferred to the post catheterization recovery area for further monitoring.  Procedural Findings:  Hemodynamics: AO:  112/70   mmHg LV:  11/0    mmHg LVEDP: 6  mmHg  Coronary angiography: Coronary dominance: codominant      codominantLeft Main:  Normal.   Left Anterior Descending (LAD):  Normal in size with 40% discrete stenosis in the midsegment. There is a discrete 80% stenosis distally close to the apex.   1st diagonal (D1):   Large in size with no significant disease.   2nd diagonal (D2)small in size with minor irregularities.  3 rd diagonal (D3):   Very small in size.   Circumflex (LCx):   normal in size with 20% stenosis in the midsegment.   1st obtuse marginal :very small in size.  Second obtuse marginal:   Large in size with no significant disease.   3rd obtuse marginal:   medium in size with minor irregularities.    AV groove continuation segment:  large in size with minor irregularities. This gives to posterolateral branches with minor  irregularities.  Right Coronary Artery:  Medium in size and codominant. There is 20% stenosis in the midsegment.   Posterior descending artery:  normal in size with minor irregularities.    Left ventriculography: Left ventricular systolic functnormal is estimated at  65  %, there is  no significant mitral regurgitation   Final Conclusions:   1. Mild nonobstructive coronary artery disease. There is 80% disease in the distal LAD but very close to the apex. 2. Normal LV systolic function and normal left ventricular end-diastolic pressure.   Recommendations:   Recommend medical therapy.I suggested treatment with a statin. However, the patient is very concerned about possible side effects.  For palpitations and NSVT, consider a 30 day monitor.   Lorine BearsMuhammad Arida MD, [facility] 01/14/2015, 2:50 PM

## 2015-01-14 NOTE — Progress Notes (Signed)
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  This return to school was an extremely stressful experience for him & his work is high stress. [facility] with various complaints of L-sided chest pain, bilateral jaw discomfort & left arm pain (not all together & not exertional).  Has also noted nausea.  EKG noted to have TWI in V3, V4 & III, aVF (? Repolarization changes) Seen by Dr. Anne FuSkains in consultation - recommended TM Myoview   Subjective:  Had a few "twinges" of discomfort - different than admitting CP.  Also notes nausea.  Lots of stress over waiting for test results. Results not back til ~6-7 PM.  No ischemia or Infarct on Myoview - but had short run of NSVT following TM. - Per d/w Dr. Anne FuSkains - Cath recommended to exclude false Negative ST.  Objective:  Vital Signs in the last 24 hours: Temp:  [97.4 F (36.3 C)-98.2 F (36.8 C)] 98.2 F (36.8 C) (02/09 2052) Pulse Rate:  [64-164] 77 (02/09 2052) Resp:  [18] 18 (02/09 2052) BP: (123-185)/(68-98) 133/82 mmHg (02/09 2052) SpO2:  [96 %-98 %] 98 % (02/09 2052)  Intake/Output from previous day:   Intake/Output from this shift:    Physical Exam: General appearance: alert, cooperative, appears stated age and seems tired & stressed.  otherwise healthy appearing Neck: no adenopathy, no carotid bruit, no JVD and supple, symmetrical, trachea midline Lungs: clear to auscultation bilaterally and normal percussion bilaterally Heart: regular rate and rhythm, S1, S2 normal, no murmur, click, rub or gallop Abdomen: soft, non-tender; bowel sounds normal; no masses,  no organomegaly Extremities: extremities normal, atraumatic, no cyanosis or edema Pulses: 2+ and symmetric Neurologic: Mental status: Alert, oriented, thought content appropriate Cranial nerves: normal  Lab Results:  Recent Labs  01/12/15 0828 01/12/15 1351  WBC 6.3 7.1  HGB 15.4 14.9  PLT 228 212    Recent Labs  01/12/15 0831 01/12/15 1351 01/13/15 0507  NA 137  --  139  K 4.0  --  4.5  CL 106  --  103  CO2 24  --  26  GLUCOSE 97  --  99  BUN 23  --  17  CREATININE 0.93 0.95 0.97    Recent Labs  01/12/15 1351 01/12/15 1852  TROPONINI <0.03 <0.03   Hepatic Function Panel  Recent Labs  01/12/15 0831  PROT 7.0  ALBUMIN 4.1  AST 34  ALT 47  ALKPHOS 80  BILITOT 0.6   No results for input(s): CHOL in the last 72 hours. No results for input(s): PROTIME in the last 72 hours.  Imaging: Imaging results have been reviewed  Cardiac Studies:  2 D Echo: EF 60-65%, Grade 1 DD (abnormal relaxation -- "normal for age").  No regional wall motion abnormalities. No notable valvular disease.  Assessment/Plan:  Principal Problem:   Chest pain with moderate risk for cardiac etiology Active Problems:   Essential hypertension  1. CP with moderate risk for cardiac etiology: Abnormal EKG & HTN in 54 y/o man who was previously healthy. He would like reassurance that he will be safe returning to he prior exercise regimen.  No regional WMA with normal EF on Echo.  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 --> If not ischemic - probably related to NPO & dehydration with hyperdynamic LV.  2. Essential HTN: BP looks great on CCB & ACE-I.  He noted that his BP was in 120s during PCP evaluation over the Christmas Holidays --> I suspect that his HTN may well have been stress related.  Was also noting HA, which can be associated with HTN.  Echo was essentially normal but with Gr 1 Diastolic Dysfxn -which is consistent with HTN Dx. 3. Stress: He is under a great deal of stress. May need additional counseling vs. SSRI.  Reassurance with Myoview will  hopefully be helpful.    If CATH result is normal (no evidence of ischemia) - would be ok to d/c today from Cardiology standpoint --> can arrange OP f/u with Dr. Anne Fu.  If PCI - will remain overnight.    LOS: 2 days    ,  W 01/14/2015, 12:22 AM

## 2015-01-14 NOTE — Progress Notes (Signed)
UR completed 

## 2015-01-14 NOTE — H&P (View-Only) (Signed)
 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  This return to school was an extremely stressful experience for him & his work is high stress. He was admitted from [facility] ER on 2/8 with various complaints of L-sided chest pain, bilateral jaw discomfort & left arm pain (not all together & not exertional).  Has also noted nausea.  EKG noted to have TWI in V3, V4 & III, aVF (? Repolarization changes) Seen by Dr. Skains in consultation - recommended TM Myoview   Subjective:  Had a few "twinges" of discomfort - different than admitting CP.  Also notes nausea.  Lots of stress over waiting for test results. Results not back til ~6-7 PM.  No ischemia or Infarct on Myoview - but had short run of NSVT following TM. - Per d/w Dr. Skains - Cath recommended to exclude false Negative ST.  Objective:  Vital Signs in the last 24 hours: Temp:  [97.4 F (36.3 C)-98.2 F (36.8 C)] 98.2 F (36.8 C) (02/09 2052) Pulse Rate:  [64-164] 77 (02/09 2052) Resp:  [18] 18 (02/09 2052) BP: (123-185)/(68-98) 133/82 mmHg (02/09 2052) SpO2:  [96 %-98 %] 98 % (02/09 2052)  Intake/Output from previous day:   Intake/Output from this shift:    Physical Exam: General appearance: alert, cooperative, appears stated age and seems tired & stressed.  otherwise healthy appearing Neck: no adenopathy, no carotid bruit, no JVD and supple, symmetrical, trachea midline Lungs: clear to auscultation bilaterally and normal percussion bilaterally Heart: regular rate and rhythm, S1, S2 normal, no murmur, click, rub or gallop Abdomen: soft, non-tender; bowel sounds normal; no masses,  no organomegaly Extremities: extremities normal, atraumatic, no cyanosis or edema Pulses: 2+ and symmetric Neurologic: Mental status: Alert, oriented, thought content appropriate Cranial nerves: normal  Lab Results:  Recent Labs  01/12/15 0828 01/12/15 1351  WBC 6.3 7.1  HGB 15.4 14.9  PLT 228 212    Recent Labs  01/12/15 0831 01/12/15 1351 01/13/15 0507  NA 137  --  139  K 4.0  --  4.5  CL 106  --  103  CO2 24  --  26  GLUCOSE 97  --  99  BUN 23  --  17  CREATININE 0.93 0.95 0.97    Recent Labs  01/12/15 1351 01/12/15 1852  TROPONINI <0.03 <0.03   Hepatic Function Panel  Recent Labs  01/12/15 0831  PROT 7.0  ALBUMIN 4.1  AST 34  ALT 47  ALKPHOS 80  BILITOT 0.6   No results for input(s): CHOL in the last 72 hours. No results for input(s): PROTIME in the last 72 hours.  Imaging: Imaging results have been reviewed  Cardiac Studies:  2 D Echo: EF 60-65%, Grade 1 DD (abnormal relaxation -- "normal for age").  No regional wall motion abnormalities. No notable valvular disease.  Assessment/Plan:  Principal Problem:   Chest pain with moderate risk for cardiac etiology Active Problems:   Essential hypertension  1. CP with moderate risk for cardiac etiology: Abnormal EKG & HTN in 53 y/o man who was previously healthy. He would like reassurance that he will be safe returning to he prior exercise regimen.  No regional WMA with normal EF on Echo.  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 --> If not ischemic - probably related to NPO & dehydration with hyperdynamic LV.  2. Essential HTN: BP looks great on CCB & ACE-I.  He noted that his BP was in 120s during PCP evaluation over the Christmas Holidays --> I suspect that his HTN may well have been stress related.  Was also noting HA, which can be associated with HTN.  Echo was essentially normal but with Gr 1 Diastolic Dysfxn -which is consistent with HTN Dx. 3. Stress: He is under a great deal of stress. May need additional counseling vs. SSRI.  Reassurance with Myoview will  hopefully be helpful.    If CATH result is normal (no evidence of ischemia) - would be ok to d/c today from Cardiology standpoint --> can arrange OP f/u with Dr. Skains.  If PCI - will remain overnight.    LOS: 2 days    HARDING, DAVID W 01/14/2015, 12:22 AM   

## 2015-01-14 NOTE — Discharge Summary (Signed)
Physician Discharge Summary  Durel Saltshomas E Oliff WUJ:[id number] DOB: March 21, 1961 DOA: 01/12/2015  PCP: No primary care provider on file.  Admit date: 01/12/2015 Discharge date: 01/14/2015  Time spent: 45 minutes  Recommendations for Outpatient Follow-up:  1. Follow up with Cardiology as an outpatient for 30 day monitor   Discharge Diagnoses:  Principal Problem:   Chest pain with moderate risk for cardiac etiology Active Problems:   Essential hypertension  Discharge Condition: stable  Diet recommendation: heart healthy  Filed Weights   01/12/15 1316  Weight: 84.097 kg (185 lb 6.4 oz)   History of present illness:  54 y/o M, bland PMH [facility] with intermittent chest pain.3 week history, on and off, occasional left arm radiation. 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 ++ Multiple psycho-social stressors - 52 year old son = asperger's disease + autism spectrum-very challenging home situation. No fever no chills no nausea no vomiting no recent long car travel, no testosterone use nonsmoker, teetotaler. Pain intensified when he helping patient do passive AROM exercises. Patient himself feels that this is all stress related-no musculoskeletal tenderness. 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 Sedentary X 3 years~current weight 190, previously 160. Currently chest pain-free Aspirin given in emergency room and cardiology consulted  Hospital Course:  Patient admitted with chest pain, and cardiology was consulted and have followed patient while hopsitalized. He underwent a stress test which had low risk findings, however patient developed NSVT and a cardiac catheterization was recommended. Patient underwent cath on 2/10 which showed  mild non obstructive CAD, normal LVEF. There is 80% distal LAD disease close to apex not amenable to intervention. Patient was started on Lisinopril and Amlodipine for HTN which will be continued. He was recommended to start a statin however patient declined due to multiple friends having side effects. For his NSVT, Cardiology will set up a 30 day event monitor as an outpatient. He was discharged home in stable condition and will need outpatient follow up with cardiology as above.   Procedures:  Cardiac cath 2/10  Stress test 2/9   Consultations:  Cardiology   Discharge Exam: Filed Vitals:   01/14/15 1530 01/14/15 1600 01/14/15 1630 01/14/15 1700  BP: 146/97 146/78 152/76 138/84  Pulse: 70 47 82 81  Temp:      TempSrc:      Resp:      Height:      Weight:      SpO2: 97% 96% 97% 97%    General: NAD Cardiovascular: RRR Respiratory: CTA biL  Discharge Instructions     Medication List    TAKE these medications        amLODipine 5 MG tablet  Commonly known as:  NORVASC  Take 1 tablet (5 mg total) by mouth daily.  Notes to Patient:  Decreases blood pressure and work of the heart; decreases chest pain     ibuprofen 200 MG tablet  Commonly known as:  ADVIL,MOTRIN  Take 400 mg by mouth every 6 (six) hours as needed for headache or moderate pain.     lisinopril 10 MG tablet  Commonly known as:  PRINIVIL,ZESTRIL  Take 1 tablet (10 mg total) by mouth daily.           Follow-up Information    Follow up with  Donato Schultz, MD.   Specialty:  Cardiology   Why:  office will contact you   Contact information:   [address] [place] [address] [phone]       The results of significant diagnostics from this hospitalization (including imaging, microbiology, ancillary and laboratory) are listed below for reference.    Significant Diagnostic Studies: Nm Myocar Multi W/spect W/wall Motion / Ef  01/13/2015   CLINICAL DATA:  Chest pain and  hypertension.  EXAM: MYOCARDIAL IMAGING WITH SPECT (REST AND EXERCISE)  GATED LEFT VENTRICULAR WALL MOTION STUDY  LEFT VENTRICULAR EJECTION FRACTION  TECHNIQUE: Standard myocardial SPECT imaging was performed after resting intravenous injection of 10 mCi Tc-59m sestamibi. Subsequently, exercise tolerance test was performed by the patient under the supervision of the Cardiology staff. At peak-stress, 30 mCi Tc-52m sestamibi was injected intravenously and standard myocardial SPECT imaging was performed. Quantitative gated imaging was also performed to evaluate left ventricular wall motion, and estimate left ventricular ejection fraction.  COMPARISON:  None.  FINDINGS: The patient achieved a maximal heart rate of 169 beats per minute with treadmill exercise. This is 100% of predicted maximum for age. He completed stage for of Bruce protocol. No abnormal blood pressure response to exercise was noted.  Perfusion: No evidence of inducible myocardial ischemia. No fixed perfusion defects are identified.  Wall Motion: Normal left ventricular wall motion. No left ventricular dilation.  Left Ventricular Ejection Fraction: 58 %  End diastolic volume 69 ml  End systolic volume 29 ml  IMPRESSION: 1. No reversible ischemia or infarction.  2. Normal left ventricular wall motion.  3. Left ventricular ejection fraction 58%  4. Low-risk stress test findings*.  *2012 Appropriate Use Criteria for Coronary Revascularization Focused Update: J Am Coll Cardiol. 2012;59(9):857-881. http://content.dementiazones.com.aspx?articleid=1201161   Electronically Signed   By: Irish Lack M.D.   On: 01/13/2015 18:21   Dg Chest Port 1 View  01/12/2015   CLINICAL DATA:  Intermittent left-sided chest pain radiating to left arm.  EXAM: PORTABLE CHEST - 1 VIEW  COMPARISON:  None.  FINDINGS: The heart size and mediastinal contours are within normal limits. Both lungs are clear. The visualized skeletal structures are unremarkable.  IMPRESSION: No  active disease.   Electronically Signed   By: Charlett Nose M.D.   On: 01/12/2015 09:15   Labs: Basic Metabolic Panel:  Recent Labs Lab 01/12/15 0831 01/12/15 1351 01/13/15 0507  NA 137  --  139  K 4.0  --  4.5  CL 106  --  103  CO2 24  --  26  GLUCOSE 97  --  99  BUN 23  --  17  CREATININE 0.93 0.95 0.97  CALCIUM 9.1  --  9.1   Liver Function Tests:  Recent Labs Lab 01/12/15 0831  AST 34  ALT 47  ALKPHOS 80  BILITOT 0.6  PROT 7.0  ALBUMIN 4.1   CBC:  Recent Labs Lab 01/12/15 0828 01/12/15 1351  WBC 6.3 7.1  HGB 15.4 14.9  HCT 45.6 43.3  MCV 91.4 90.8  PLT 228 212   Cardiac Enzymes:  Recent Labs Lab 01/12/15 1351 01/12/15 1852  TROPONINI <0.03 <0.03   Signed:  Pamella Pert  [facility] 01/14/2015, 6:35 PM

## 2015-01-14 NOTE — Interval H&P Note (Signed)
History and Physical Interval Note:  01/14/2015 2:08 PM  Glenn Lawson  has presented today for surgery, with the diagnosis of cp  The various methods of treatment have been discussed with the patient and family. After consideration of risks, benefits and other options for treatment, the patient has consented to  Procedure(s): LEFT HEART CATHETERIZATION WITH CORONARY ANGIOGRAM (N/A) as a surgical intervention .  The patient's history has been reviewed, patient examined, no change in status, stable for surgery.  I have reviewed the patient's chart and labs.  Questions were answered to the patient's satisfaction.     Lorine BearsMuhammad Arida

## 2015-01-14 NOTE — Progress Notes (Signed)
TR BAND REMOVAL  LOCATION:    right radial  DEFLATED PER PROTOCOL:    Yes.    TIME BAND OFF / DRESSING APPLIED:    1730   SITE UPON ARRIVAL:    Level 0  SITE AFTER BAND REMOVAL:    Level 0  CIRCULATION SENSATION AND MOVEMENT:    Within Normal Limits   Yes.    COMMENTS:   Rechecked site at 1800 with no change, CSMs wnls, pulses +2 and dressing remains dry and intact

## 2015-01-14 NOTE — Progress Notes (Signed)
Wife here. Dr. Kirke CorinArida talking with both. To lab.

## 2015-01-16 ENCOUNTER — Telehealth: Payer: Self-pay | Admitting: Cardiology

## 2015-01-16 NOTE — Telephone Encounter (Signed)
Patient has questions from hospital stay. He is agreeable to discuss this a post hosp follow up appt on Tuesday with the PA.  Questions are: What caused CP? What generated these pains? What risk am I in now? Want to know if he can start exercising? He would like to lose 20-25 pds Is he supposed to be getting a monitor?

## 2015-01-16 NOTE — Telephone Encounter (Signed)
New message     Pt was discharged from the hosp last wed.  He had a cath.  Pt request to talk to a nurse-----he has questions.

## 2015-01-20 ENCOUNTER — Ambulatory Visit (INDEPENDENT_AMBULATORY_CARE_PROVIDER_SITE_OTHER): Payer: BLUE CROSS/BLUE SHIELD | Admitting: Cardiology

## 2015-01-20 ENCOUNTER — Encounter: Payer: Self-pay | Admitting: Cardiology

## 2015-01-20 VITALS — BP 120/90 | HR 57 | Ht 69.0 in | Wt 188.4 lb

## 2015-01-20 DIAGNOSIS — I10 Essential (primary) hypertension: Secondary | ICD-10-CM

## 2015-01-20 DIAGNOSIS — R002 Palpitations: Secondary | ICD-10-CM

## 2015-01-20 NOTE — Progress Notes (Signed)
01/20/2015 Glenn Lawson   01/08/1961  [id number]  Primary Physician: No primary care provider on file. Primary Cardiologist: Dr. Kirke Corin  Reason for Follow-up/CC: Follow-up for CAD; s/p LHC  HPI: Glenn Lawson presents to clinic today for post hospital follow-up after being evaluated for chest pain. He was initially admitted to [facility] 01/12/2007 for various complaints of L-sided chest pain, bilateral jaw discomfort & left arm pain (not all together & not exertional).His  EKG was noted to have TWI in V3, V4 & III, aVF (? Repolarization changes). He underwent a TM Myoview. This revealed no ischemia but he was observed to have runs of NSVT during stress portion of test. Subsequently, he was referred for [facility]. LHC performed by Dr. Kirke Corin and revealed mild nonobstructive coronary artery disease. There was 80% disease in the distal LAD but very close to the apex. He was noted to have normal LV systolic function and normal left ventricular end-diastolic pressure.  EF was estimated at 65%. Medical therapy was recommended. He was placed on amlodipine and lisinopril for HTN. No BB was added due to resting bradycardia. Statin therapy was discussed but patient refused due to potential side effects.   He presents back to clinic today for follow-up. He denies any recurrent CP. No dyspnea, lightheadedness, palpitations, dizziness, syncope/ near syncope. He has been very anxious regarding his recent hospitalization. He reports full medication compliance. BP today is well controlled at 120/90. EKG shows sinus brady with HR of 57 bpm.   Current Outpatient Prescriptions  Medication Sig Dispense Refill  . amLODipine (NORVASC) 5 MG tablet Take 1 tablet (5 mg total) by mouth daily. 30 tablet 1  . lisinopril (PRINIVIL,ZESTRIL) 10 MG tablet Take 1 tablet (10 mg total) by mouth daily. 30 tablet 1   No current facility-administered medications for this visit.    Allergies  Allergen Reactions  . Asa  [Aspirin] Other (See Comments)    Nose bleeds    History   Social History  . Marital Status: Single    Spouse Name: N/A  . Number of Children: N/A  . Years of Education: N/A   Occupational History  . Not on file.   Social History Main Topics  . Smoking status: Never Smoker   . Smokeless tobacco: Never Used  . Alcohol Use: No  . Drug Use: Not on file  . Sexual Activity: Yes   Other Topics Concern  . Not on file   Social History Narrative     Review of Systems: General: negative for chills, fever, night sweats or weight changes.  Cardiovascular: negative for chest pain, dyspnea on exertion, edema, orthopnea, palpitations, paroxysmal nocturnal dyspnea or shortness of breath Dermatological: negative for rash Respiratory: negative for cough or wheezing Urologic: negative for hematuria Abdominal: negative for nausea, vomiting, diarrhea, bright red blood per rectum, melena, or hematemesis Neurologic: negative for visual changes, syncope, or dizziness All other systems reviewed and are otherwise negative except as noted above.    Blood pressure 120/90, pulse 57, height  (1.753 m), weight 188 lb 6.4 oz (85.458 kg).  General appearance: alert, cooperative and no distress Neck: no carotid bruit and no JVD Lungs: clear to auscultation bilaterally Heart: regular rate and rhythm, S1, S2 normal, no murmur, click, rub or gallop Extremities: no LEE Pulses: 2+ and symmetric Skin: warm and dry Neurologic: Grossly normal  EKG Sinus bradycardia. HR 57 bpm.  ASSESSMENT AND PLAN:   1. CAD: LHC revealed mild nonobstructive coronary artery disease  with 80% disease in the distal LAD but very close to the apex. Normal LVF. No recurrent CP. Continue medical therapy. No BB due to bradycardia. He refuses to take statin therapy due to side effects.   2. HTN: well controlled. Continue amlodipine and lisinopril.   3. NSVT: runs were noted during hospitalization. 30 day cardiac monitor  was recommended by Dr. Kirke CorinArida. Pt wants to consider this further. Will place order. Pt will call back this week to set up appointment to get fitted if he decides on monitor.   PLAN  Continue medical therapy. Patient is very anxious regarding his heart and overall health. I reassured him that his echo was normal and LHC only revealed mild nonobstructive CAD and that the LAD lesion was very distal and there was no significant ischemia in this territory based on stress test.  However, due to his anxiety, I have recommended F/U with Dr. Anne FuSkains in 3 months for repeat evaluation and to see how he is doing.   Note: more than 30 minutes was spent with direct face to face time discussing diagnosis, treatment plan and patient education.   SIMMONS, BRITTAINYPA-C 01/20/2015 4:30 PM

## 2015-01-20 NOTE — Patient Instructions (Signed)
Your physician recommends that you continue on your current medications as directed. Please refer to the Current Medication list given to you today.  Your physician recommends that you schedule a follow-up appointment in: 3 months with Dr.Skains  Call the office if you decide to to wear the heart monitor

## 2015-01-28 ENCOUNTER — Telehealth: Payer: Self-pay | Admitting: *Deleted

## 2015-01-28 NOTE — Telephone Encounter (Signed)
Patient requested price of cardiac event monitor.  Advised to call [employer] company directly since type and cost of monitor will vary with with insurance companies.  Left telephone number for [employer].

## 2015-04-01 ENCOUNTER — Other Ambulatory Visit: Payer: Self-pay

## 2015-04-01 MED ORDER — LISINOPRIL 10 MG PO TABS: 10.0000 mg | ORAL_TABLET | Freq: Every day | ORAL | Status: AC

## 2015-04-01 MED ORDER — AMLODIPINE BESYLATE 5 MG PO TABS: 5.0000 mg | ORAL_TABLET | Freq: Every day | ORAL | Status: AC

## 2015-04-14 ENCOUNTER — Ambulatory Visit (INDEPENDENT_AMBULATORY_CARE_PROVIDER_SITE_OTHER): Payer: BLUE CROSS/BLUE SHIELD | Admitting: Cardiology

## 2015-04-14 ENCOUNTER — Encounter: Payer: Self-pay | Admitting: *Deleted

## 2015-04-14 VITALS — BP 120/80 | HR 87 | Ht 69.0 in | Wt 187.0 lb

## 2015-04-14 DIAGNOSIS — I10 Essential (primary) hypertension: Secondary | ICD-10-CM

## 2015-04-14 DIAGNOSIS — R079 Chest pain, unspecified: Secondary | ICD-10-CM

## 2015-04-14 NOTE — Patient Instructions (Signed)
Medication Instructions:  Your physician recommends that you continue on your current medications as directed. Please refer to the Current Medication list given to you today.  Follow-Up: Follow up in 6 months with Dr. Skains.  You will receive a letter in the mail 2 months before you are due.  Please call us when you receive this letter to schedule your follow up appointment.  Thank you for choosing [facility]!!     

## 2015-04-14 NOTE — Progress Notes (Signed)
04/14/2015 Glenn Lawson E Luviano   July 29, 1961  [id number]  Primary Physician: No primary care provider on file. Primary Cardiologist: Dr. Kirke CorinArida, Dr. Anne FuSkains  Reason for Follow-up/CC: Follow-up for CAD; s/p LHC  HPI: Mr. Glenn Lawson presents to clinic today for post hospital follow-up after being evaluated for chest pain. He was [facility] for various complaints of L-sided chest pain, bilateral jaw discomfort & left arm pain (not all together & not exertional).His  EKG was noted to have TWI in V3, V4 & III, aVF (? Repolarization changes). He underwent a TM Myoview. This revealed no ischemia but he was observed to have runs of NSVT during stress portion of test. Subsequently, he was referred for [facility]. LHC performed by Dr. Kirke CorinArida and revealed mild nonobstructive coronary artery disease. There was 80% disease in the distal LAD but very close to the apex. He was noted to have normal LV systolic function and normal left ventricular end-diastolic pressure.  EF was estimated at 65%. Medical therapy was recommended. He was placed on amlodipine and lisinopril for HTN. No BB was added due to resting bradycardia. Statin therapy was discussed but patient refused due to potential side effects.   He presents back to clinic today for follow-up. He denies any recurrent CP. No dyspnea, lightheadedness, palpitations, dizziness, syncope/ near syncope. He has been very anxious regarding his recent hospitalization. He reports full medication compliance. BP today is well controlled at 120/90. EKG shows sinus brady with HR of 57 bpm. his son was present. Mountain biking. Hurt his knee.     Current Outpatient Prescriptions  Medication Sig Dispense Refill  . fluticasone (FLONASE) 50 MCG/ACT nasal spray As needed for allergies  0  . amLODipine (NORVASC) 5 MG tablet Take 1 tablet (5 mg total) by mouth daily. 30 tablet 1  . lisinopril (PRINIVIL,ZESTRIL) 10 MG tablet Take 1 tablet (10 mg total)  by mouth daily. 30 tablet 1   No current facility-administered medications for this visit.    Allergies  Allergen Reactions  . Asa [Aspirin] Other (See Comments)    Nose bleeds    History   Social History  . Marital Status: Single    Spouse Name: N/A  . Number of Children: N/A  . Years of Education: N/A   Occupational History  . Not on file.   Social History Main Topics  . Smoking status: Never Smoker   . Smokeless tobacco: Never Used  . Alcohol Use: No  . Drug Use: Not on file  . Sexual Activity: Yes   Other Topics Concern  . Not on file   Social History Narrative     Review of Systems: General: negative for chills, fever, night sweats or weight changes.  Cardiovascular: negative for chest pain, dyspnea on exertion, edema, orthopnea, palpitations, paroxysmal nocturnal dyspnea or shortness of breath Dermatological: negative for rash Respiratory: negative for cough or wheezing Urologic: negative for hematuria Abdominal: negative for nausea, vomiting, diarrhea, bright red blood per rectum, melena, or hematemesis Neurologic: negative for visual changes, syncope, or dizziness All other systems reviewed and are otherwise negative except as noted above.    Blood pressure 120/80, pulse 87, height 5\' 9"  (1.753 m), weight 187 lb (84.823 kg), SpO2 97 %.  General appearance: alert, cooperative and no distress Neck: no carotid bruit and no JVD Lungs: clear to auscultation bilaterally Heart: regular rate and rhythm, S1, S2 normal, no murmur, click, rub or gallop Extremities: no LEE Pulses: 2+ and symmetric Skin:  warm and dry Neurologic: Grossly normal  EKG Sinus bradycardia. HR 57 bpm.  ASSESSMENT AND PLAN:   1. CAD: LHC revealed mild nonobstructive coronary artery disease with 80% disease in the distal LAD but very close to the apex. Normal LVF. No recurrent CP. Continue medical therapy. No BB due to bradycardia. He refuses to take statin therapy due to side effects.  He had friends who had myopathy and his legs never been the same since. He does not wish to take beta blocker. He also does not wish to take isosorbide. We will continue to monitor. Understands that there is a risk with his distal coronary artery disease.  2. HTN: well controlled. Continue amlodipine and lisinopril. No change.   3. NSVT: runs were noted during hospitalization. 30 day cardiac monitor was recommended by Dr. Kirke CorinArida. Pt wants to consider this further. He did not wish to go further with event monitor. He was worried about cost. He understands risks of not obtaining this monitor.  PLAN  Continue medical therapy. Patient is very anxious regarding his heart and overall health. I reassured him that his echo was normal and LHC only revealed mild distal  CAD and that the LAD lesion was very distal and there was no significant ischemia in this territory based on stress test.  We will see him back in 6 months.     [name],   MD  04/14/2015 4:27 PM

## 2016-02-19 ENCOUNTER — Other Ambulatory Visit (HOSPITAL_COMMUNITY): Payer: Self-pay | Admitting: Family Medicine

## 2016-02-19 ENCOUNTER — Ambulatory Visit (HOSPITAL_COMMUNITY)
Admission: RE | Admit: 2016-02-19 | Discharge: 2016-02-19 | Disposition: A | Discharge status: Home / Self Care | Payer: BLUE CROSS/BLUE SHIELD | Source: Ambulatory Visit | Attending: Family Medicine | Admitting: Family Medicine

## 2016-02-19 DIAGNOSIS — R52 Pain, unspecified: Secondary | ICD-10-CM

## 2016-02-19 DIAGNOSIS — M79662 Pain in left lower leg: Secondary | ICD-10-CM | POA: Diagnosis not present

## 2016-02-19 DIAGNOSIS — M7989 Other specified soft tissue disorders: Secondary | ICD-10-CM | POA: Diagnosis not present

## 2016-02-19 NOTE — Progress Notes (Signed)
Preliminary results by tech - Left Lower Ext. Venous Duplex Completed. Negative for deep and superficial vein thrombosis.   , BS, RDMS, RVT  

## 2018-05-02 DIAGNOSIS — J069 Acute upper respiratory infection, unspecified: Secondary | ICD-10-CM | POA: Diagnosis not present

## 2018-05-02 DIAGNOSIS — I10 Essential (primary) hypertension: Secondary | ICD-10-CM | POA: Diagnosis not present

## 2018-05-04 DIAGNOSIS — R509 Fever, unspecified: Secondary | ICD-10-CM | POA: Diagnosis not present

## 2018-05-04 DIAGNOSIS — R0602 Shortness of breath: Secondary | ICD-10-CM | POA: Diagnosis not present

## 2018-05-07 DIAGNOSIS — W57XXXS Bitten or stung by nonvenomous insect and other nonvenomous arthropods, sequela: Secondary | ICD-10-CM | POA: Diagnosis not present

## 2018-05-07 DIAGNOSIS — J069 Acute upper respiratory infection, unspecified: Secondary | ICD-10-CM | POA: Diagnosis not present

## 2018-05-30 DIAGNOSIS — I10 Essential (primary) hypertension: Secondary | ICD-10-CM | POA: Diagnosis not present

## 2018-05-30 DIAGNOSIS — R509 Fever, unspecified: Secondary | ICD-10-CM | POA: Diagnosis not present

## 2018-05-30 DIAGNOSIS — Z0000 Encounter for general adult medical examination without abnormal findings: Secondary | ICD-10-CM | POA: Diagnosis not present

## 2023-07-13 ENCOUNTER — Emergency Department (HOSPITAL_COMMUNITY)
Admission: EM | Admit: 2023-07-13 | Discharge: 2023-07-13 | Disposition: A | Discharge status: Home / Self Care | Payer: BLUE CROSS/BLUE SHIELD | Attending: Emergency Medicine | Admitting: Emergency Medicine

## 2023-07-13 ENCOUNTER — Other Ambulatory Visit: Payer: Self-pay

## 2023-07-13 ENCOUNTER — Emergency Department (HOSPITAL_COMMUNITY): Payer: BLUE CROSS/BLUE SHIELD

## 2023-07-13 DIAGNOSIS — N132 Hydronephrosis with renal and ureteral calculous obstruction: Secondary | ICD-10-CM | POA: Insufficient documentation

## 2023-07-13 DIAGNOSIS — N201 Calculus of ureter: Secondary | ICD-10-CM

## 2023-07-13 DIAGNOSIS — Z79899 Other long term (current) drug therapy: Secondary | ICD-10-CM | POA: Insufficient documentation

## 2023-07-13 DIAGNOSIS — I10 Essential (primary) hypertension: Secondary | ICD-10-CM | POA: Insufficient documentation

## 2023-07-13 LAB — URINALYSIS, W/ REFLEX TO CULTURE (INFECTION SUSPECTED)
Bacteria, UA: NONE SEEN
Bilirubin Urine: NEGATIVE
Glucose, UA: NEGATIVE mg/dL
Hgb urine dipstick: NEGATIVE
Ketones, ur: 5 mg/dL — AB
Leukocytes,Ua: NEGATIVE
Nitrite: NEGATIVE
Protein, ur: NEGATIVE mg/dL
Specific Gravity, Urine: 1.011 (ref 1.005–1.030)
pH: 8 (ref 5.0–8.0)

## 2023-07-13 LAB — CBC WITH DIFFERENTIAL/PLATELET
Abs Immature Granulocytes: 0.06 10*3/uL (ref 0.00–0.07)
Basophils Absolute: 0.1 10*3/uL (ref 0.0–0.1)
Basophils Relative: 0 %
Eosinophils Absolute: 0.1 10*3/uL (ref 0.0–0.5)
Eosinophils Relative: 1 %
HCT: 45.9 % (ref 39.0–52.0)
Hemoglobin: 15.8 g/dL (ref 13.0–17.0)
Immature Granulocytes: 1 %
Lymphocytes Relative: 16 %
Lymphs Abs: 1.9 10*3/uL (ref 0.7–4.0)
MCH: 30.7 pg (ref 26.0–34.0)
MCHC: 34.4 g/dL (ref 30.0–36.0)
MCV: 89.1 fL (ref 80.0–100.0)
Monocytes Absolute: 0.8 10*3/uL (ref 0.1–1.0)
Monocytes Relative: 7 %
Neutro Abs: 9.4 10*3/uL — ABNORMAL HIGH (ref 1.7–7.7)
Neutrophils Relative %: 75 %
Platelets: 235 10*3/uL (ref 150–400)
RBC: 5.15 MIL/uL (ref 4.22–5.81)
RDW: 12.2 % (ref 11.5–15.5)
WBC: 12.4 10*3/uL — ABNORMAL HIGH (ref 4.0–10.5)
nRBC: 0 % (ref 0.0–0.2)

## 2023-07-13 LAB — COMPREHENSIVE METABOLIC PANEL
ALT: 41 U/L (ref 0–44)
AST: 30 U/L (ref 15–41)
Albumin: 4 g/dL (ref 3.5–5.0)
Alkaline Phosphatase: 65 U/L (ref 38–126)
Anion gap: 13 (ref 5–15)
BUN: 22 mg/dL — ABNORMAL HIGH (ref 6–20)
CO2: 20 mmol/L — ABNORMAL LOW (ref 22–32)
Calcium: 9.6 mg/dL (ref 8.9–10.3)
Chloride: 103 mmol/L (ref 98–111)
Creatinine, Ser: 1.11 mg/dL (ref 0.61–1.24)
GFR, Estimated: >60 mL/min (ref >60)
Glucose, Bld: 152 mg/dL — ABNORMAL HIGH (ref 70–99)
Potassium: 3.7 mmol/L (ref 3.5–5.1)
Sodium: 136 mmol/L (ref 135–145)
Total Bilirubin: 1.5 mg/dL — ABNORMAL HIGH (ref 0.3–1.2)
Total Protein: 7 g/dL (ref 6.5–8.1)

## 2023-07-13 MED ORDER — ONDANSETRON HCL 4 MG/2ML IJ SOLN
4.0000 mg | Freq: Once | INTRAMUSCULAR | Status: AC
  Administered 2023-07-13: 4 mg via INTRAVENOUS
  Filled 2023-07-13: qty 2

## 2023-07-13 MED ORDER — OXYCODONE-ACETAMINOPHEN 5-325 MG PO TABS: 1.0000 | ORAL_TABLET | Freq: Four times a day (QID) | ORAL | 0 refills | Status: AC | PRN

## 2023-07-13 MED ORDER — KETOROLAC TROMETHAMINE 15 MG/ML IJ SOLN
15.0000 mg | Freq: Once | INTRAMUSCULAR | Status: AC
  Administered 2023-07-13: 15 mg via INTRAVENOUS
  Filled 2023-07-13: qty 1

## 2023-07-13 MED ORDER — TAMSULOSIN HCL 0.4 MG PO CAPS: 0.4000 mg | ORAL_CAPSULE | Freq: Every day | ORAL | 0 refills | Status: AC

## 2023-07-13 MED ORDER — HYDROMORPHONE HCL 1 MG/ML IJ SOLN
1.0000 mg | Freq: Once | INTRAMUSCULAR | Status: AC
  Administered 2023-07-13: 1 mg via INTRAVENOUS
  Filled 2023-07-13: qty 1

## 2023-07-13 MED ORDER — ONDANSETRON 4 MG PO TBDP: 4.0000 mg | ORAL_TABLET | Freq: Three times a day (TID) | ORAL | 0 refills | Status: AC | PRN

## 2023-07-13 NOTE — ED Triage Notes (Signed)
Patient BIB [facility] from home due to lower right abdominal pain starting at 0400. Patient ambulated back to room. No hx of kidney stones. Hx of HTN but does not take medication for such. Patient states pain is sharp and 10/10. Patient A&Ox4.

## 2023-07-13 NOTE — ED Provider Notes (Signed)
[place] EMERGENCY DEPARTMENT AT [facility] Provider Note   CSN: [id number] Arrival date & time: 07/13/23  4098     History  Chief Complaint  Patient presents with   Abdominal Pain    Glenn Lawson is a 62 y.o. male.  The history is provided by the patient.  Abdominal Pain Glenn Lawson is a 62 y.o. male who presents to the Emergency Department complaining of abdominal pain.  The emergency department by EMS for evaluation of abrupt onset right lower quadrant abdominal pain that radiates to his testicle that started around 4 AM.  Has associated vomiting.  No fever, dysuria.  No prior similar symptoms.  He has a history of hypertension, no additional medical problems.     Home Medications Prior to Admission medications   Medication Sig Start Date End Date Taking? Authorizing Provider  ondansetron (ZOFRAN-ODT) 4 MG disintegrating tablet Take 1 tablet (4 mg total) by mouth every 8 (eight) hours as needed for nausea or vomiting. 07/13/23  Yes Tilden Fossa, MD  oxyCODONE-acetaminophen (PERCOCET/ROXICET) 5-325 MG tablet Take 1 tablet by mouth every 6 (six) hours as needed for severe pain. 07/13/23  Yes Tilden Fossa, MD  tamsulosin (FLOMAX) 0.4 MG CAPS capsule Take 1 capsule (0.4 mg total) by mouth daily. 07/13/23  Yes Tilden Fossa, MD  amLODipine (NORVASC) 5 MG tablet Take 1 tablet (5 mg total) by mouth daily. 04/01/15   Jake Bathe, MD  fluticasone Aleda Grana) 50 MCG/ACT nasal spray As needed for allergies 01/26/15   [provider]  lisinopril (PRINIVIL,ZESTRIL) 10 MG tablet Take 1 tablet (10 mg total) by mouth daily. 04/01/15   Jake Bathe, MD      Allergies    Asa [aspirin]    Review of Systems   Review of Systems  Gastrointestinal:  Positive for abdominal pain.  All other systems reviewed and are negative.   Physical Exam Updated Vital Signs BP (!) 180/81   Pulse 62   Temp 97.8 F (36.6 C) (Oral)   Resp 18   Ht 5\' 10"  (1.778 m)   Wt 81.6 kg    SpO2 98%   BMI 25.83 kg/m  Physical Exam Vitals and nursing note reviewed.  Constitutional:      Appearance: He is well-developed.     Comments: Uncomfortable appearing  HENT:     Head: Normocephalic and atraumatic.  Cardiovascular:     Rate and Rhythm: Normal rate and regular rhythm.     Heart sounds: No murmur heard. Pulmonary:     Effort: Pulmonary effort is normal. No respiratory distress.     Breath sounds: Normal breath sounds.  Abdominal:     Palpations: Abdomen is soft.     Tenderness: There is no abdominal tenderness. There is no guarding or rebound.  Musculoskeletal:        General: No tenderness.  Skin:    General: Skin is warm and dry.  Neurological:     Mental Status: He is alert and oriented to person, place, and time.  Psychiatric:        Behavior: Behavior normal.     ED Results / Procedures / Treatments   Labs (all labs ordered are listed, but only abnormal results are displayed) Labs Reviewed  COMPREHENSIVE METABOLIC PANEL - Abnormal; Notable for the following components:      Result Value   CO2 20 (*)    Glucose, Bld 152 (*)    BUN 22 (*)    Total Bilirubin  1.5 (*)    All other components within normal limits  CBC WITH DIFFERENTIAL/PLATELET - Abnormal; Notable for the following components:   WBC 12.4 (*)    Neutro Abs 9.4 (*)    All other components within normal limits  URINALYSIS, W/ REFLEX TO CULTURE (INFECTION SUSPECTED) - Abnormal; Notable for the following components:   Color, Urine STRAW (*)    Ketones, ur 5 (*)    All other components within normal limits    EKG None  Radiology CT Renal Stone Study  Result Date: 07/13/2023 CLINICAL DATA:  62 year old male with history of abdominal pain. Suspected kidney stone. EXAM: CT ABDOMEN AND PELVIS WITHOUT CONTRAST TECHNIQUE: Multidetector CT imaging of the abdomen and pelvis was performed following the standard protocol without IV contrast. RADIATION DOSE REDUCTION: This exam was performed  according to the departmental dose-optimization program which includes automated exposure control, adjustment of the mA and/or kV according to patient size and/or use of iterative reconstruction technique. COMPARISON:  No priors. FINDINGS: Lower chest: Atherosclerotic calcifications in the left circumflex and right coronary arteries. Hepatobiliary: No definite suspicious cystic or solid hepatic lesions are confidently identified on today's noncontrast CT examination. Unenhanced appearance of the gallbladder is unremarkable. Pancreas: No definite pancreatic mass or peripancreatic fluid collections or inflammatory changes are noted on today's noncontrast CT examination. Spleen: Unremarkable. Adrenals/Urinary Tract: 3 mm nonobstructive calculi are noted in the upper pole collecting system of the left kidney and interpolar collecting system of the right kidney. In addition, at the right ureterovesicular junction (axial image 75 of series 3) there is a 3 mm calculus which is associated with mild proximal right hydroureteronephrosis. No additional calculi are noted along the course of the left ureter. No left hydroureteronephrosis. Unenhanced appearance of the kidneys, bilateral adrenal glands and urinary bladder is otherwise unremarkable. Stomach/Bowel: Unenhanced appearance of the stomach is normal. No pathologic dilatation of small bowel or colon. Numerous colonic diverticula are noted, without surrounding inflammatory changes to indicate an acute diverticulitis at this time. Normal appendix. Vascular/Lymphatic: Atherosclerosis in the abdominal aorta and pelvic vasculature. No lymphadenopathy noted in the abdomen or pelvis. Reproductive: Prostate gland and seminal vesicles are unremarkable in appearance. Other: No significant volume of ascites.  No pneumoperitoneum. Musculoskeletal: 9 mm sclerotic lesion with narrow zone of transition in the right ilium just lateral to the sacroiliac joint, likely a bone island. There  are no other aggressive appearing lytic or blastic lesions noted in the visualized portions of the skeleton. IMPRESSION: 1. 3 mm obstructing calculus at the right ureterovesicular junction with mild proximal right hydroureteronephrosis. 2. Additional 3 mm nonobstructive calculi in the collecting systems of both kidneys, as above. Electronically Signed   By: Trudie Reed M.D.   On: 07/13/2023 06:30    Procedures Procedures    Medications Ordered in ED Medications  HYDROmorphone (DILAUDID) injection 1 mg (1 mg Intravenous Given 07/13/23 0536)  ondansetron (ZOFRAN) injection 4 mg (4 mg Intravenous Given 07/13/23 0535)  ketorolac (TORADOL) 15 MG/ML injection 15 mg (15 mg Intravenous Given 07/13/23 0604)    ED Course/ Medical Decision Making/ A&P                                 Medical Decision Making Amount and/or Complexity of Data Reviewed Labs: ordered. Radiology: ordered.  Risk Prescription drug management.   Pt here for evaluation of RLQ pain that started today with nausea.  He is uncomfortable on  exam.  He was treated with dilaudid with mild transient improvement in sxs.  After toradol administration his sxs significantly improved and pain was well controlled.  CT demonstrates 3 mm right UVJ stone. BMP with nl renal function.  UA not c/w UTI.  Discussed with pt home care for renal stone.   Discussed urology follow up as well as return precautions.         Final Clinical Impression(s) / ED Diagnoses Final diagnoses:  Ureteral stone    Rx / DC Orders ED Discharge Orders          Ordered    oxyCODONE-acetaminophen (PERCOCET/ROXICET) 5-325 MG tablet  Every 6 hours PRN        07/13/23 0704    tamsulosin (FLOMAX) 0.4 MG CAPS capsule  Daily        07/13/23 0704    ondansetron (ZOFRAN-ODT) 4 MG disintegrating tablet  Every 8 hours PRN        07/13/23 0704              Tilden Fossa, MD 07/13/23 [phone]

## 2023-07-13 NOTE — Discharge Instructions (Addendum)
You have a 3 mm stone in your right ureter.  Drink plenty of fluids.  Get rechecked immediately if you have fevers, uncontrolled pain or cannot urinate.

## 2023-07-13 NOTE — ED Notes (Signed)
Patient transported to CT 

## 2023-07-13 NOTE — ED Notes (Signed)
Patient ambulated to bathroom.
